# Patient Record
Sex: Female | Born: 1940 | Hispanic: No
Health system: Southern US, Community
[De-identification: ages and names within clinical notes are randomized; demographics above are authoritative.]

## PROBLEM LIST (undated history)

## (undated) DIAGNOSIS — M545 Low back pain, unspecified: Secondary | ICD-10-CM

## (undated) DIAGNOSIS — M25551 Pain in right hip: Secondary | ICD-10-CM

## (undated) DIAGNOSIS — S32000A Wedge compression fracture of unspecified lumbar vertebra, initial encounter for closed fracture: Secondary | ICD-10-CM

## (undated) DIAGNOSIS — M810 Age-related osteoporosis without current pathological fracture: Secondary | ICD-10-CM

## (undated) HISTORY — PX: OTHER SURGICAL HISTORY: SHX169

---

## 1999-03-22 ENCOUNTER — Ambulatory Visit (HOSPITAL_COMMUNITY): Admission: RE | Admit: 1999-03-22 | Discharge: 1999-03-22 | Payer: Self-pay | Admitting: *Deleted

## 2012-06-20 ENCOUNTER — Other Ambulatory Visit: Payer: Self-pay | Admitting: Endocrinology

## 2012-06-20 DIAGNOSIS — E0590 Thyrotoxicosis, unspecified without thyrotoxic crisis or storm: Secondary | ICD-10-CM

## 2012-06-26 ENCOUNTER — Other Ambulatory Visit: Payer: Self-pay

## 2012-06-28 ENCOUNTER — Ambulatory Visit
Admission: RE | Admit: 2012-06-28 | Discharge: 2012-06-28 | Disposition: A | Discharge status: Home / Self Care | Payer: Medicare Other | Source: Ambulatory Visit | Attending: Endocrinology | Admitting: Endocrinology

## 2012-06-28 DIAGNOSIS — E0590 Thyrotoxicosis, unspecified without thyrotoxic crisis or storm: Secondary | ICD-10-CM

## 2012-07-15 ENCOUNTER — Other Ambulatory Visit (HOSPITAL_COMMUNITY): Payer: Self-pay | Admitting: Endocrinology

## 2012-07-15 DIAGNOSIS — E042 Nontoxic multinodular goiter: Secondary | ICD-10-CM

## 2012-07-15 DIAGNOSIS — E0590 Thyrotoxicosis, unspecified without thyrotoxic crisis or storm: Secondary | ICD-10-CM

## 2012-07-29 ENCOUNTER — Encounter (HOSPITAL_COMMUNITY)
Admission: RE | Admit: 2012-07-29 | Discharge: 2012-07-29 | Disposition: A | Discharge status: Home / Self Care | Payer: Medicare Other | Source: Ambulatory Visit | Attending: Endocrinology | Admitting: Endocrinology

## 2012-07-29 DIAGNOSIS — E0590 Thyrotoxicosis, unspecified without thyrotoxic crisis or storm: Secondary | ICD-10-CM | POA: Insufficient documentation

## 2012-07-29 DIAGNOSIS — E042 Nontoxic multinodular goiter: Secondary | ICD-10-CM

## 2012-07-30 ENCOUNTER — Encounter (HOSPITAL_COMMUNITY)
Admission: RE | Admit: 2012-07-30 | Discharge: 2012-07-30 | Disposition: A | Discharge status: Home / Self Care | Payer: Medicare Other | Source: Ambulatory Visit | Attending: Endocrinology | Admitting: Endocrinology

## 2012-07-30 MED ORDER — SODIUM IODIDE I 131 CAPSULE
7.4000 | Freq: Once | INTRAVENOUS | Status: AC | PRN
  Administered 2012-07-29: 7.4 via ORAL

## 2012-07-30 MED ORDER — SODIUM PERTECHNETATE TC 99M INJECTION
10.3000 | Freq: Once | INTRAVENOUS | Status: AC | PRN
  Administered 2012-07-30: 10 via INTRAVENOUS

## 2012-08-09 ENCOUNTER — Other Ambulatory Visit: Payer: Self-pay | Admitting: Endocrinology

## 2012-08-09 DIAGNOSIS — E042 Nontoxic multinodular goiter: Secondary | ICD-10-CM

## 2012-08-13 ENCOUNTER — Other Ambulatory Visit (HOSPITAL_COMMUNITY)
Admission: RE | Admit: 2012-08-13 | Discharge: 2012-08-13 | Disposition: A | Discharge status: Home / Self Care | Payer: Medicare Other | Source: Ambulatory Visit | Attending: Diagnostic Radiology | Admitting: Diagnostic Radiology

## 2012-08-13 ENCOUNTER — Ambulatory Visit
Admission: RE | Admit: 2012-08-13 | Discharge: 2012-08-13 | Disposition: A | Discharge status: Home / Self Care | Payer: Medicare Other | Source: Ambulatory Visit | Attending: Endocrinology | Admitting: Endocrinology

## 2012-08-13 DIAGNOSIS — E049 Nontoxic goiter, unspecified: Secondary | ICD-10-CM | POA: Insufficient documentation

## 2012-08-13 DIAGNOSIS — E042 Nontoxic multinodular goiter: Secondary | ICD-10-CM

## 2012-08-14 ENCOUNTER — Inpatient Hospital Stay: Admission: RE | Admit: 2012-08-14 | Payer: Medicare Other | Source: Ambulatory Visit

## 2012-08-14 ENCOUNTER — Other Ambulatory Visit: Payer: Medicare Other

## 2013-07-17 ENCOUNTER — Other Ambulatory Visit: Payer: Self-pay | Admitting: Endocrinology

## 2013-07-17 DIAGNOSIS — E042 Nontoxic multinodular goiter: Secondary | ICD-10-CM

## 2013-07-24 ENCOUNTER — Ambulatory Visit
Admission: RE | Admit: 2013-07-24 | Discharge: 2013-07-24 | Disposition: A | Discharge status: Home / Self Care | Payer: Medicare Other | Source: Ambulatory Visit | Attending: Endocrinology | Admitting: Endocrinology

## 2013-07-24 DIAGNOSIS — E042 Nontoxic multinodular goiter: Secondary | ICD-10-CM

## 2013-12-22 ENCOUNTER — Other Ambulatory Visit: Payer: Self-pay | Admitting: Internal Medicine

## 2013-12-22 DIAGNOSIS — E049 Nontoxic goiter, unspecified: Secondary | ICD-10-CM

## 2014-01-27 ENCOUNTER — Other Ambulatory Visit: Payer: Medicare Other

## 2014-03-30 ENCOUNTER — Other Ambulatory Visit: Payer: Medicare Other

## 2014-06-09 IMAGING — US US SOFT TISSUE HEAD/NECK
1 series · 14 of 25 positions shown · non-contrast
Comparison: None.

CLINICAL DATA: Hyperthyroidism, evaluate nodules

THYROID ULTRASOUND
TECHNIQUE: Ultrasound examination of the thyroid gland and adjacent
soft tissues was performed.

[Series 1: us soft tissue head/neck · 0.08mm/px · 14 of 79 slices shown]
[im 1/79]
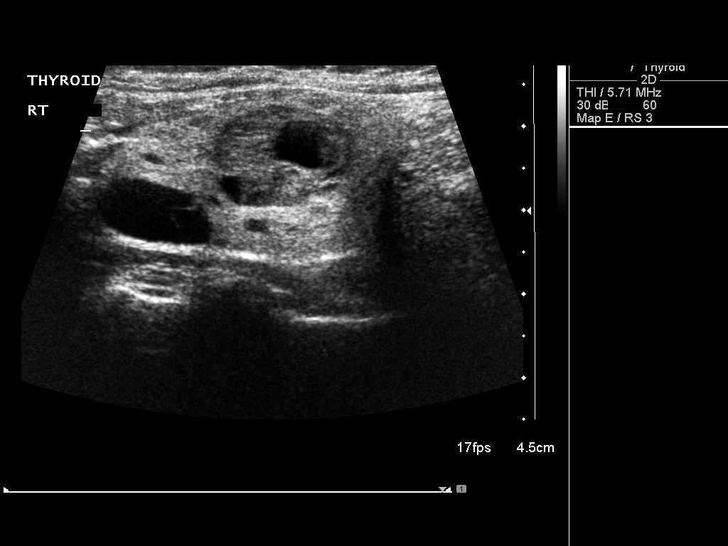
[im 7/79]
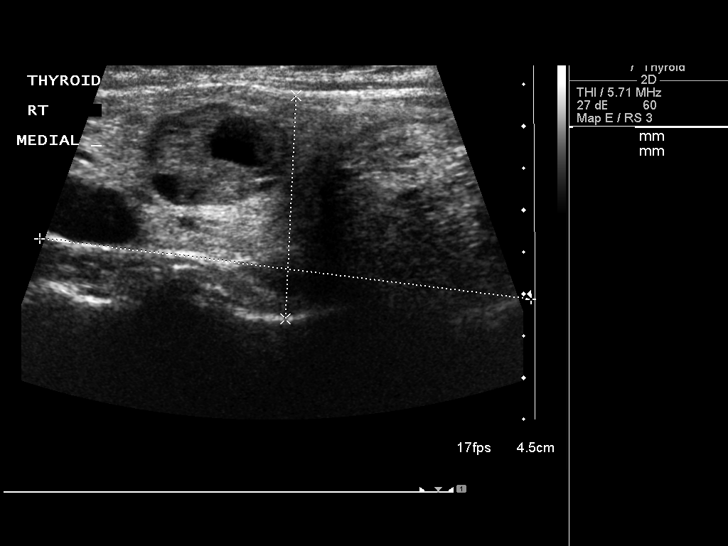
[im 14/79]
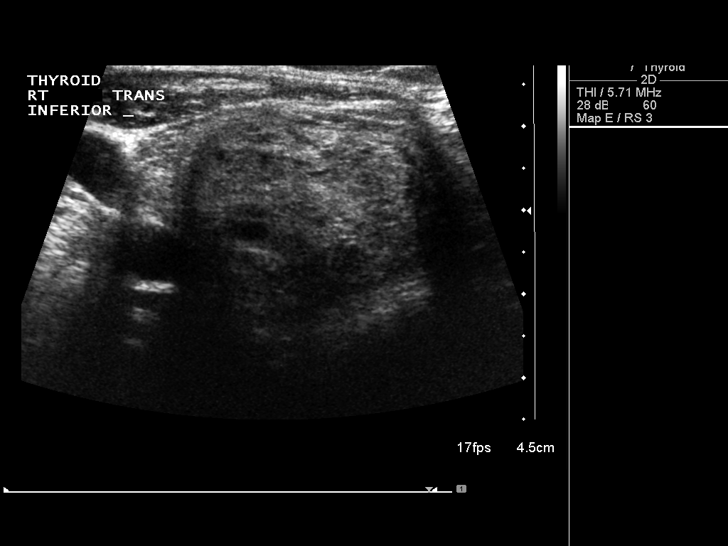
[im 20/79]
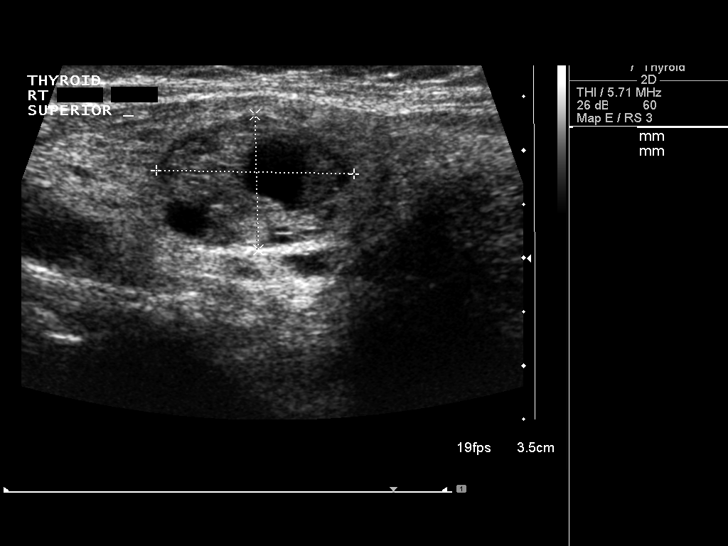
[im 27/79]
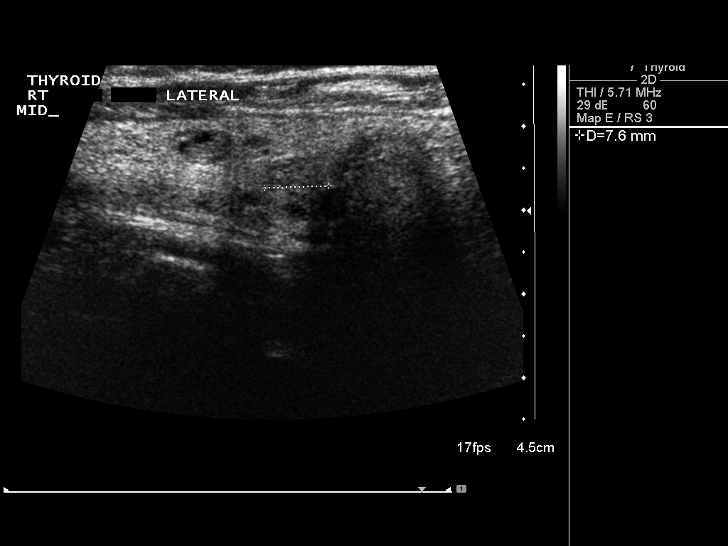
[im 30/79]
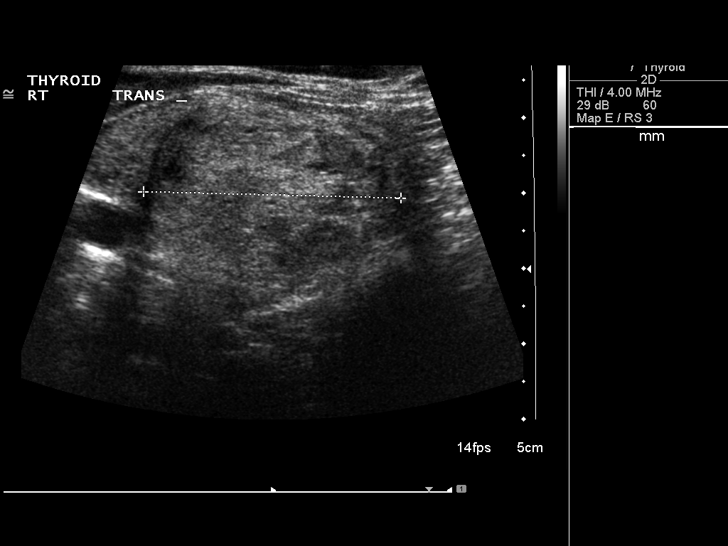
[im 36/79]
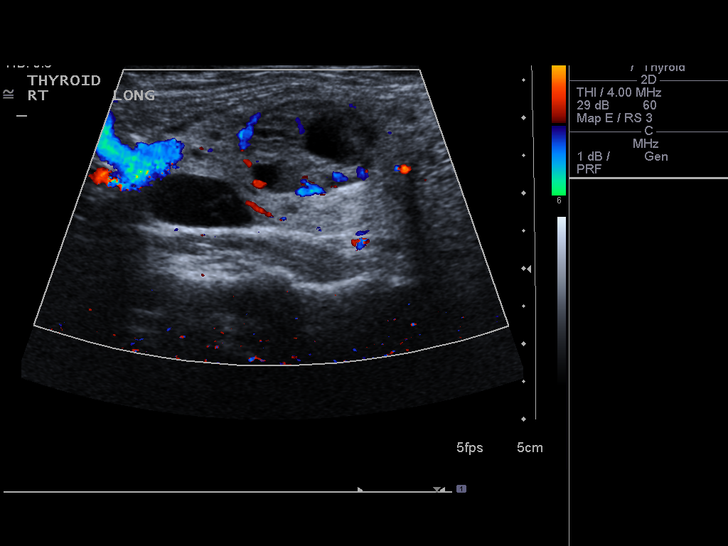
[im 43/79]
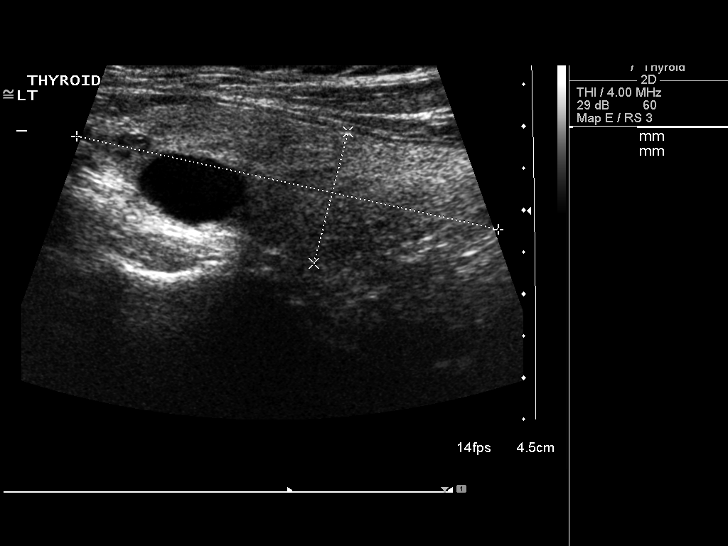
[im 49/79]
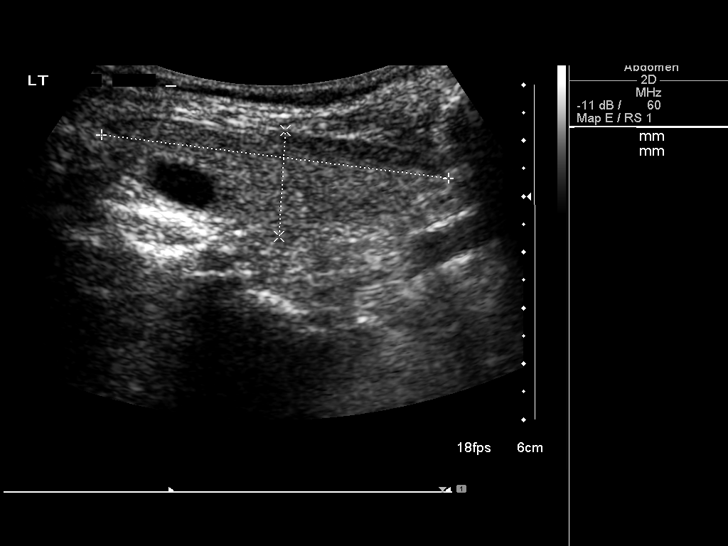
[im 53/79]
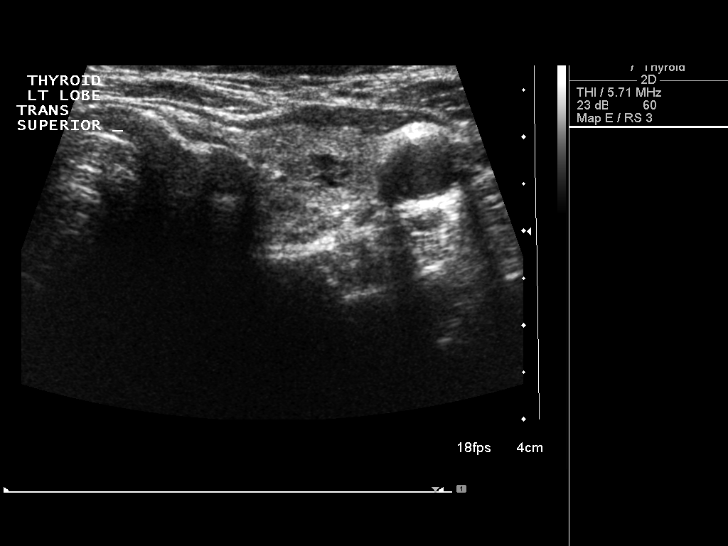
[im 59/79]
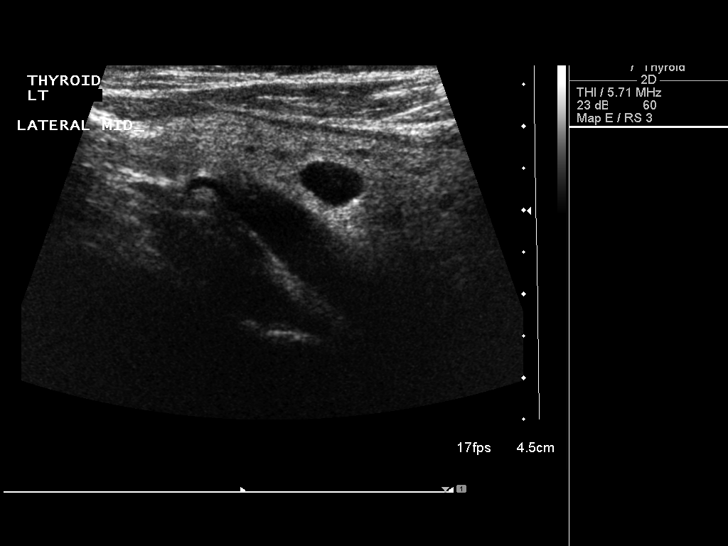
[im 66/79]
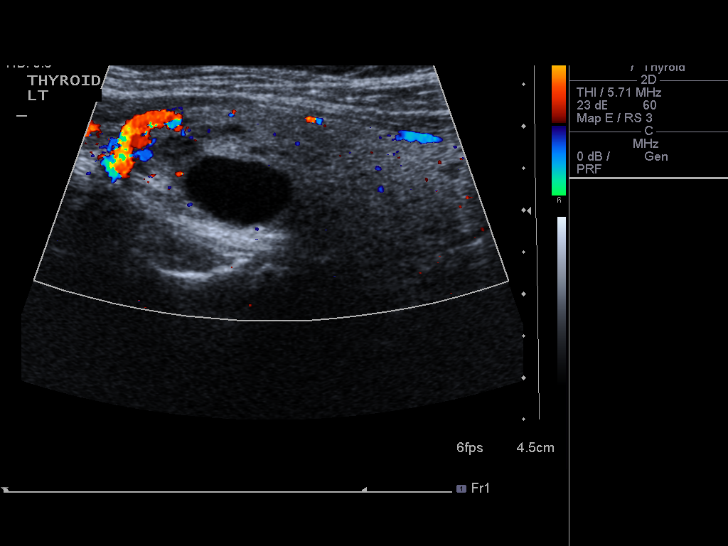
[im 72/79]
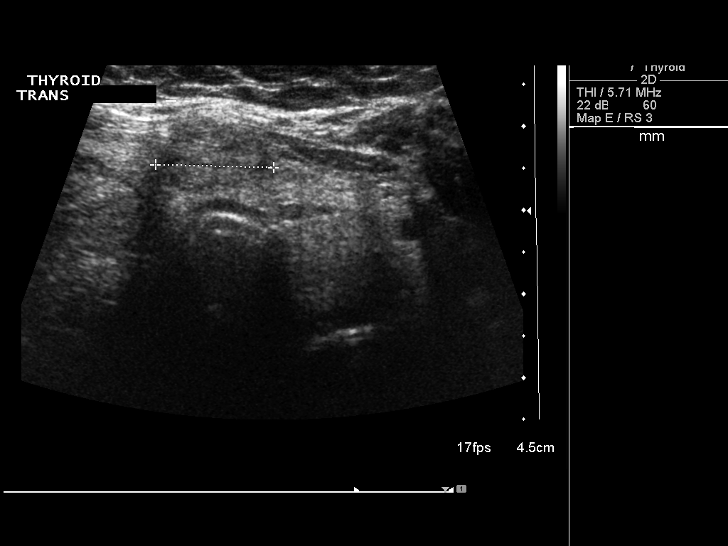
[im 79/79]
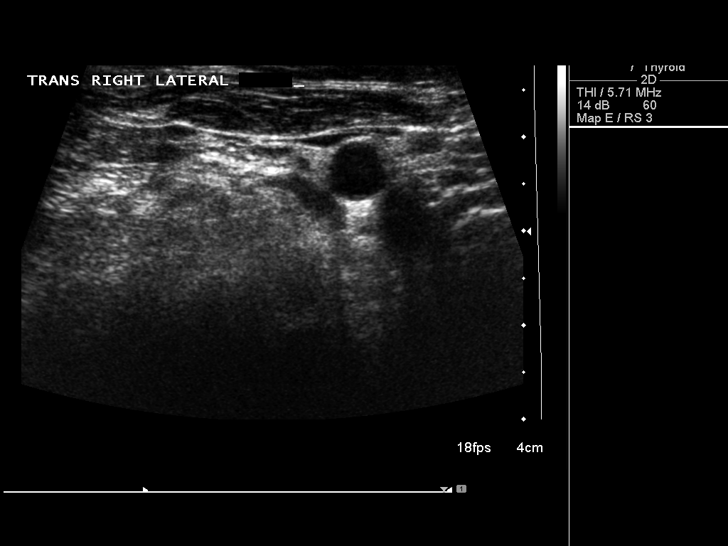

[14 of 25 positions shown; findings below may reference images not displayed]

FINDINGS: Right thyroid lobe:  7.1 x 2.8 x 3.0 cm
Left thyroid lobe:  6.3 x 1.9 x 2.6 cm
Isthmus:

Focal nodules:  Thyroid gland is heterogeneous with multiple cystic
nodules.  There is a large solid nodule in the inferior right lobe
of thyroid gland measuring 3.4 x 2.6 x 3.4 cm.  There is a mixed
solid cystic nodule more superiorly in the right lobe of thyroid
gland measuring 1.8 cm.

Lymphadenopathy:  None visualized.
IMPRESSION: 1..  Multi nodular enlarged thyroid gland with hyperthyroidism is
most consistent with toxic multinodular goiter.
2. Two dominant nodules in the right lobe of thyroid gland are
solid and warrant biopsy to exclude malignancy.

## 2014-07-13 ENCOUNTER — Other Ambulatory Visit (HOSPITAL_COMMUNITY): Payer: Self-pay | Admitting: Interventional Radiology

## 2014-07-13 DIAGNOSIS — S32010B Wedge compression fracture of first lumbar vertebra, initial encounter for open fracture: Secondary | ICD-10-CM

## 2014-07-14 ENCOUNTER — Ambulatory Visit
Admission: RE | Admit: 2014-07-14 | Discharge: 2014-07-14 | Disposition: A | Discharge status: Home / Self Care | Payer: Medicare Other | Source: Ambulatory Visit | Attending: Interventional Radiology | Admitting: Interventional Radiology

## 2014-07-14 DIAGNOSIS — S32010B Wedge compression fracture of first lumbar vertebra, initial encounter for open fracture: Secondary | ICD-10-CM

## 2014-07-14 DIAGNOSIS — S32010A Wedge compression fracture of first lumbar vertebra, initial encounter for closed fracture: Secondary | ICD-10-CM | POA: Insufficient documentation

## 2014-07-14 HISTORY — DX: Low back pain: M54.5

## 2014-07-14 HISTORY — DX: Age-related osteoporosis without current pathological fracture: M81.0

## 2014-07-14 HISTORY — DX: Low back pain, unspecified: M54.50

## 2014-07-14 HISTORY — DX: Wedge compression fracture of unspecified lumbar vertebra, initial encounter for closed fracture: S32.000A

## 2014-07-14 HISTORY — DX: Pain in right hip: M25.551

## 2014-07-14 NOTE — Consult Note (Signed)
Chief Complaint: Chief Complaint  Patient presents with  . Follow-up    4 mo follow up kyphoplasty/verterbroplasty      Referring Physician(s): Pamela Rowe  History of Present Illness: Pamela Rowe is a 74 y.o. female kyphoplasty performed at L1 4 months ago. She had continued pain but this resolved one month after treatment and after physical therapy. She does have some continued achiness, but the severe acute pain from the fracture has resolved. She did fall recently and had radiographs yesterday which were reportedly negative. She did have some continued pain 1 month ago and underwent a lumbar MRI which demonstrated no new vertebral compression fracture. She does have edema in the lower sacrum. This is likely a small insufficiency fracture. She denies any pain along her sacrum. She does complain of some achiness across her low back. She explains that she has been falling because of dizziness and an unsteady gait. She does not know the etiology nor has this been worked up.  Past Medical History  Diagnosis Date  . Osteoporosis   . Low back pain   . Right hip pain   . Compression fracture of lumbar vertebra     Past Surgical History  Procedure Laterality Date  . Kyophoplasy/vertebroplasty        Allergies: Codeine and Morphine and related  Medications: Prior to Admission medications   Medication Sig Start Date End Date Taking? Authorizing Provider  amiodarone (PACERONE) 200 MG tablet Take 200 mg by mouth 2 (two) times daily. Take two tablets (total of 400 mg) po bid   Yes Historical Provider, MD  aspirin EC 81 MG tablet Take 81 mg by mouth daily.   Yes Historical Provider, MD  b complex vitamins tablet Take 1 tablet by mouth daily.   Yes Historical Provider, MD  cholecalciferol (VITAMIN D) 400 UNITS TABS tablet Take 400 Units by mouth.   Yes Historical Provider, MD  Cyanocobalamin (VITAMIN B 12 PO) Place 1 tablet under the tongue daily.   Yes Historical Provider, MD    escitalopram (LEXAPRO) 20 MG tablet Take 20 mg by mouth daily.   Yes Historical Provider, MD  furosemide (LASIX) 40 MG tablet Take 40 mg by mouth daily.   Yes Historical Provider, MD  losartan (COZAAR) 100 MG tablet Take 100 mg by mouth daily.   Yes Historical Provider, MD  methimazole (TAPAZOLE) 5 MG tablet Take 5 mg by mouth daily.   Yes Historical Provider, MD  methocarbamol (ROBAXIN) 500 MG tablet Take 500 mg by mouth every 6 (six) hours as needed for muscle spasms.   Yes Historical Provider, MD  ondansetron (ZOFRAN) 8 MG tablet Take by mouth every 8 (eight) hours as needed for nausea or vomiting.   Yes Historical Provider, MD  ranitidine (ZANTAC) 150 MG tablet Take 150 mg by mouth 2 (two) times daily.   Yes Historical Provider, MD  rivaroxaban (XARELTO) 20 MG TABS tablet Take 20 mg by mouth daily with supper.   Yes Historical Provider, MD     No family history on file.  History   Social History  . Marital Status: Divorced    Spouse Name: N/A  . Number of Children: N/A  . Years of Education: N/A   Social History Main Topics  . Smoking status: Not on file  . Smokeless tobacco: Not on file  . Alcohol Use: Not on file  . Drug Use: Not on file  . Sexual Activity: Not on file   Other Topics Concern  .  Not on file   Social History Narrative  . No narrative on file      Review of Systems: A 12 point ROS discussed and pertinent positives are indicated in the HPI above.  All other systems are negative.  Review of Systems  Vital Signs: BP 173/77 mmHg  Pulse 55  Temp(Src) 98 F (36.7 C) (Oral)  Resp 14  Ht 4\' 11"  (1.499 m)  Wt 140 lb (63.504 kg)  BMI 28.26 kg/m2  SpO2 99%  Physical Exam  Constitutional: She is oriented to person, place, and time. She appears well-developed and well-nourished.  Musculoskeletal:  No lumbar or thoracic spine tenderness  Neurological: She is alert and oriented to person, place, and time.    Mallampati Score:     Imaging: No  results found.  Labs:  CBC: No results for input(s): WBC, HGB, HCT, PLT in the last 8760 hours.  COAGS: No results for input(s): INR, APTT in the last 8760 hours.  BMP: No results for input(s): NA, K, CL, CO2, GLUCOSE, BUN, CALCIUM, CREATININE, GFRNONAA, GFRAA in the last 8760 hours.  Invalid input(s): CMP  LIVER FUNCTION TESTS: No results for input(s): BILITOT, AST, ALT, ALKPHOS, PROT, ALBUMIN in the last 8760 hours.  TUMOR MARKERS: No results for input(s): AFPTM, CEA, CA199, CHROMGRNA in the last 8760 hours.  Assessment and Plan:  Ms. Pamela Rowe has done very well after kyphoplasty from a healing standpoint. The procedure was successful for her and she has no new vertebral compression fracture at this time. She has fallen and is concerned about vertigo. I instructed her to contact her primary care physician for a workup. She was instructed to follow-up with us should she have a fall with acute back pain.  Thank you for this interesting consult.  I greatly enjoyed meeting Pamela Rowe and look forward to participating in their care.  Signed: Brandyce Rowe, ART A 07/14/2014, 1:52 PM   I spent a total of   15 Minutes in face to face in clinical consultation, greater than 50% of which was counseling/coordinating care for back pain and kyphoplasty.

## 2014-10-08 ENCOUNTER — Other Ambulatory Visit: Payer: Self-pay | Admitting: Internal Medicine

## 2014-10-08 DIAGNOSIS — E042 Nontoxic multinodular goiter: Secondary | ICD-10-CM

## 2014-10-16 ENCOUNTER — Encounter (INDEPENDENT_AMBULATORY_CARE_PROVIDER_SITE_OTHER): Payer: Self-pay

## 2014-10-16 ENCOUNTER — Ambulatory Visit
Admission: RE | Admit: 2014-10-16 | Discharge: 2014-10-16 | Disposition: A | Discharge status: Home / Self Care | Payer: Medicare Other | Source: Ambulatory Visit | Attending: Internal Medicine | Admitting: Internal Medicine

## 2014-10-16 DIAGNOSIS — E042 Nontoxic multinodular goiter: Secondary | ICD-10-CM

## 2015-10-20 ENCOUNTER — Other Ambulatory Visit: Payer: Self-pay | Admitting: Internal Medicine

## 2015-10-20 DIAGNOSIS — E042 Nontoxic multinodular goiter: Secondary | ICD-10-CM

## 2015-10-21 ENCOUNTER — Ambulatory Visit
Admission: RE | Admit: 2015-10-21 | Discharge: 2015-10-21 | Disposition: A | Discharge status: Home / Self Care | Payer: Medicare Other | Source: Ambulatory Visit | Attending: Internal Medicine | Admitting: Internal Medicine

## 2015-10-21 DIAGNOSIS — E042 Nontoxic multinodular goiter: Secondary | ICD-10-CM

## 2016-09-26 IMAGING — US US SOFT TISSUE HEAD/NECK
1 series · 13 of 25 positions shown · non-contrast
Comparison: 07/24/2013, 06/28/2012

08/13/2012

CLINICAL DATA: 73-year-old female with a history of goiter.

Previous biopsy of 2 right-sided thyroid nodules 08/13/2012
EXAM:
THYROID ULTRASOUND
TECHNIQUE: Ultrasound examination of the thyroid gland and adjacent soft
tissues was performed.

[Series 1: us soft tissue head/neck · 0.13mm/px · 13 of 68 slices shown]
[im 1/68]
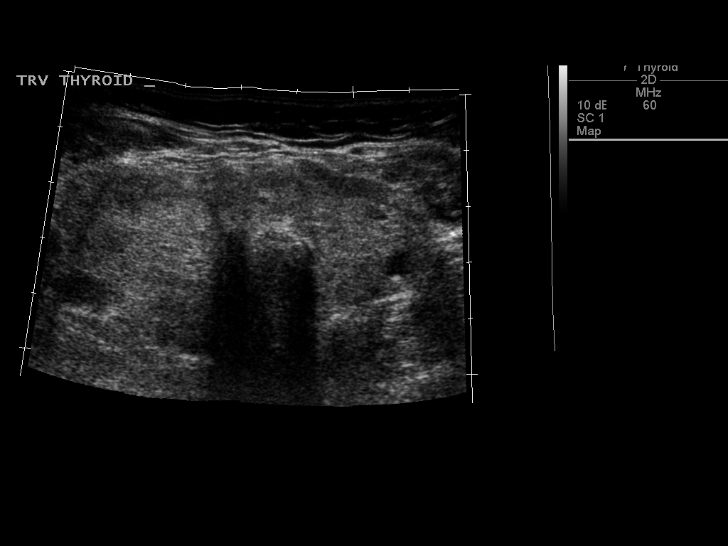
[im 6/68]
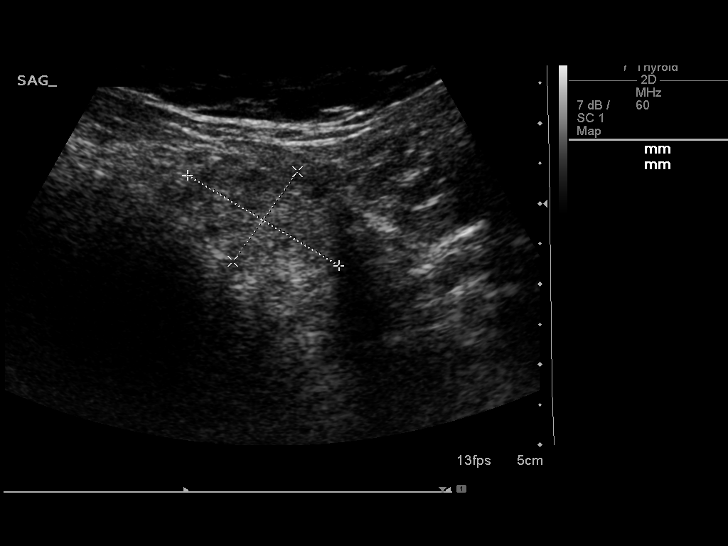
[im 12/68]
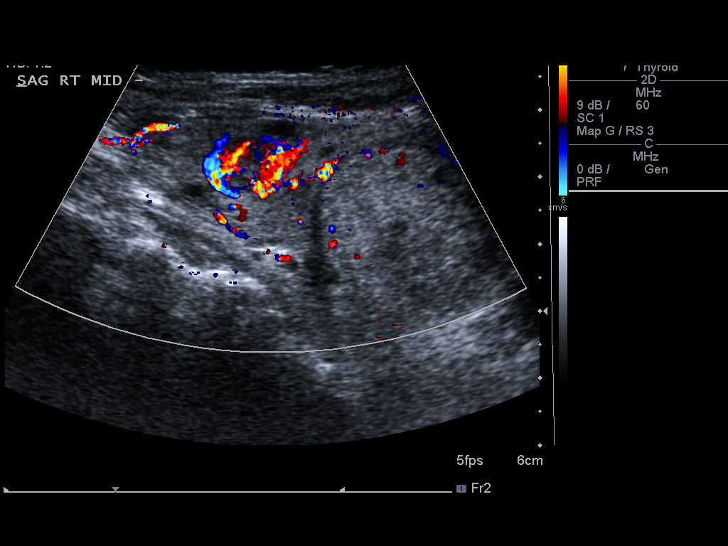
[im 17/68]
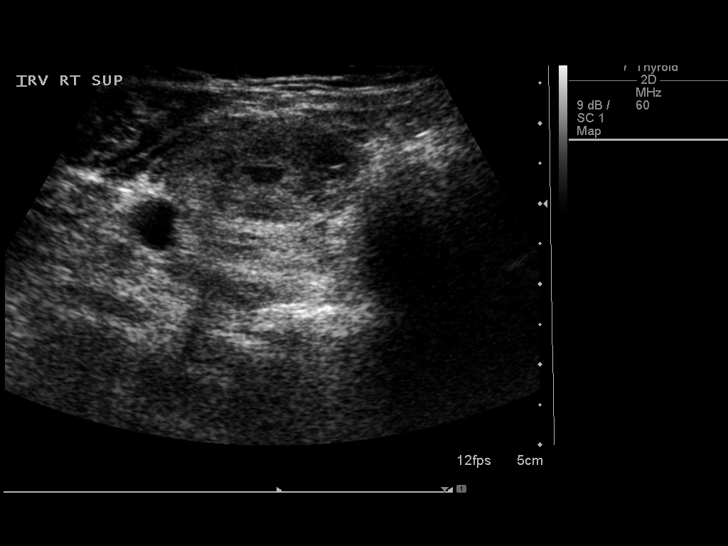
[im 23/68]
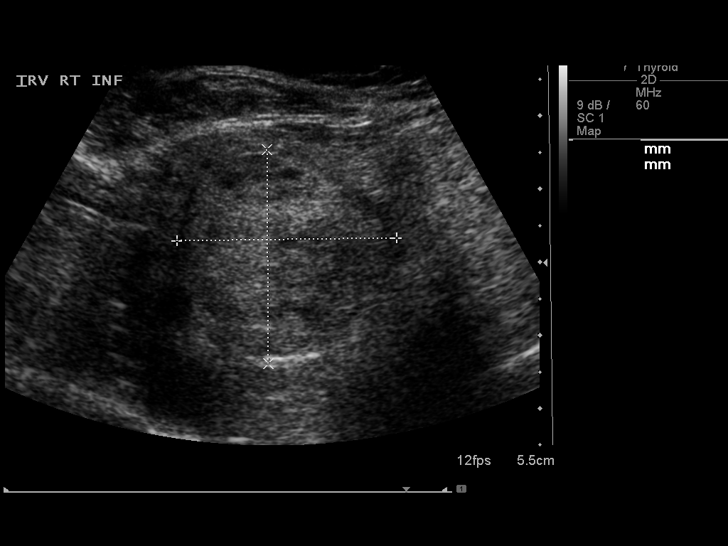
[im 28/68]
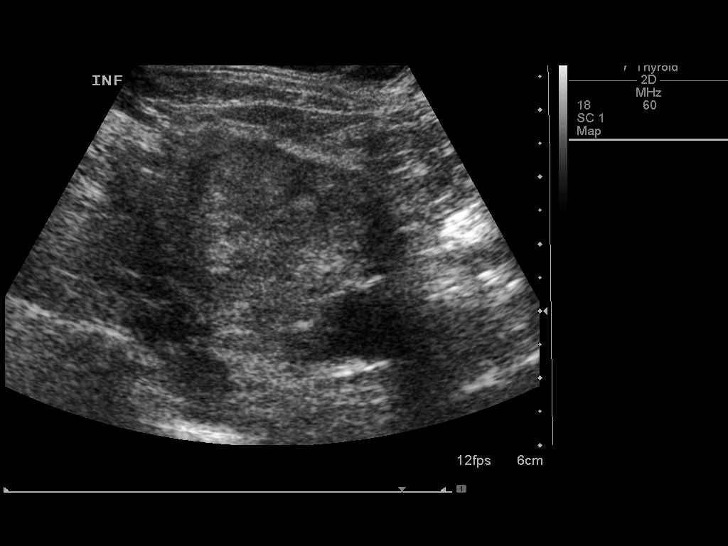
[im 34/68]
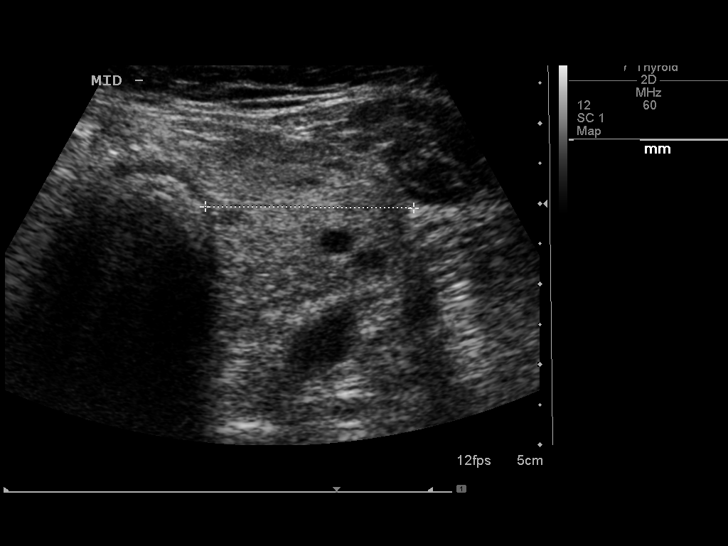
[im 40/68]
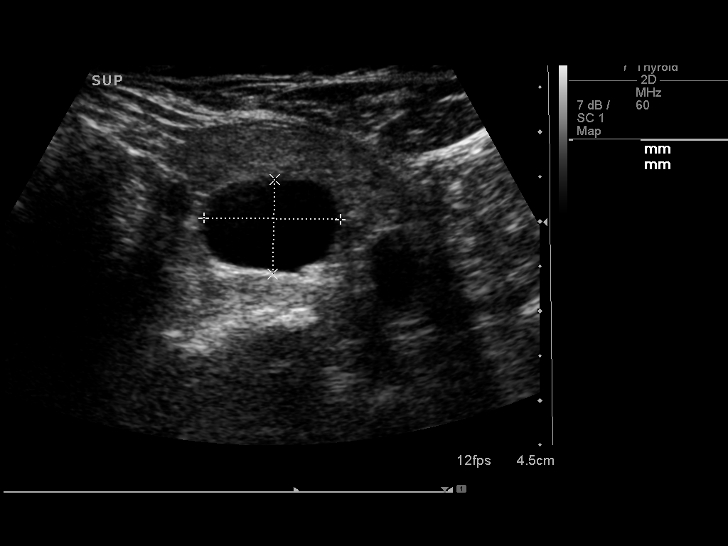
[im 45/68]
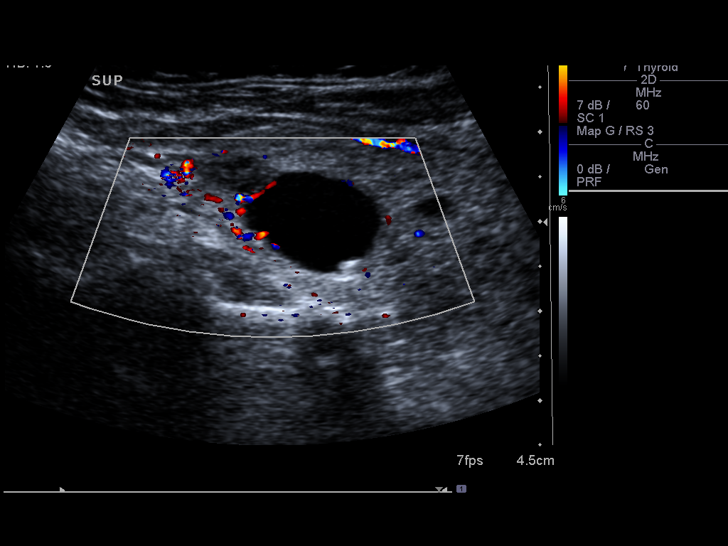
[im 51/68]
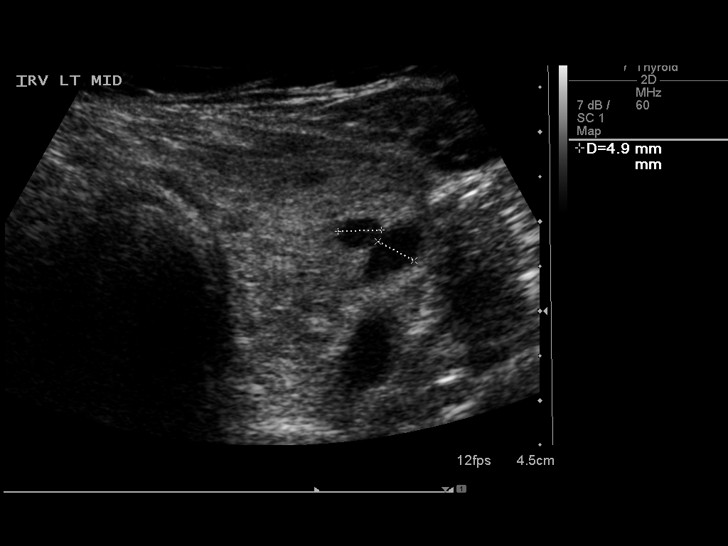
[im 56/68]
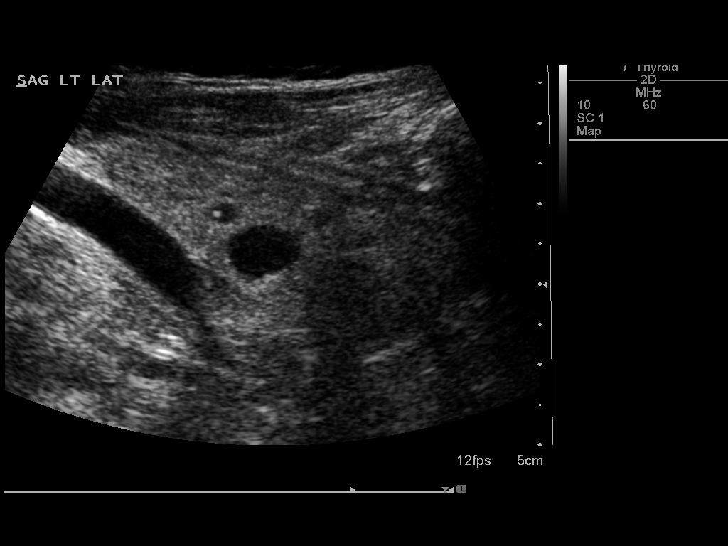
[im 62/68]
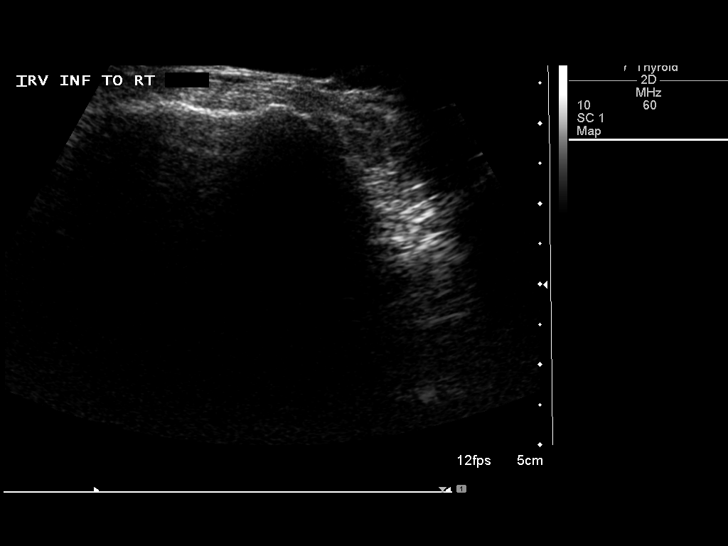
[im 68/68]
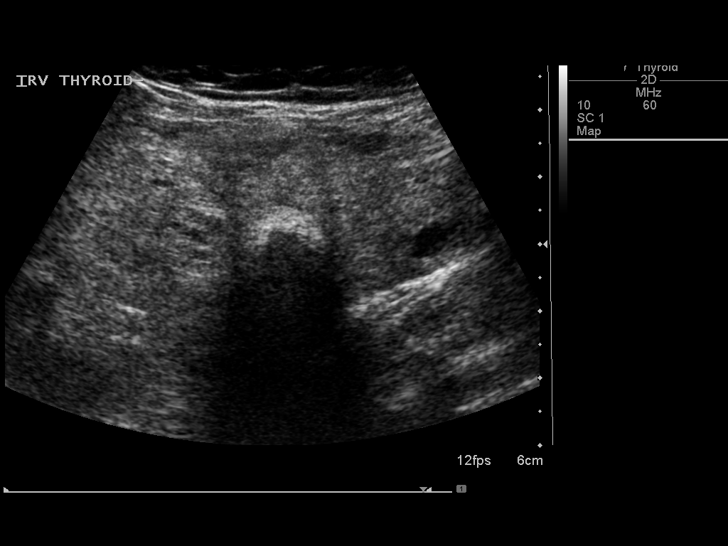

[13 of 25 positions shown; findings below may reference images not displayed]

FINDINGS: Right thyroid lobe

Measurements: 6.4 cm x 3.9 cm x 2.9 cm. Multiple right-sided nodules
identified.

Dominant nodule is inferior, previously biopsied, now measuring
cm x 2.9 cm x 3.2 cm. Solid tissue characteristics.

Next largest is superior measuring 1.9 cm x 1.3 cm x 1.9 cm,
previously biopsied.

Additional nodules on the right are less than 8 mm.

Left thyroid lobe

Measurements: 6.1 cm x 2.4 cm x 2.6 cm. Multiple nodules on the
left, most of which measure less than 8 mm.

Dominant left-sided nodules at the superior thyroid.

Most superior measures 1.3 cm with solid features. No internal
calcifications.

Cystic lesion measures 1.5 cm x 1.1 cm x 1.7 cm, with no complexity
on the wall of the nodule.

Isthmus

Thickness: 1.4 cm. Isthmic nodule measures 1.6 cm x 2.2 cm x 1.4 cm,
appears to have grown in the interval.

Lymphadenopathy

None visualized.
IMPRESSION: Multinodular thyroid. Two right-sided nodules have been previously
biopsied. Recommend correlation with prior biopsy results.

Isthmic nodule meets criteria for biopsy, and has grown since prior
diagnostic ultrasound.

Ultrasound-guided fine needle aspiration should be considered, as
per the consensus statement: Management of Thyroid Nodules Detected
at US: Society of Radiologists in Ultrasound Consensus Conference

Left-sided nodules do not meet criteria for biopsy.

## 2017-10-01 IMAGING — US US SOFT TISSUE HEAD/NECK
1 series · 13 of 25 positions shown · non-contrast
Comparison: 10/16/2014

CLINICAL DATA: Goiter and thyroid nodules.

EXAM:
THYROID ULTRASOUND
TECHNIQUE: Ultrasound examination of the thyroid gland and adjacent soft
tissues was performed.

[Series 1: us soft tissue head/neck · 0.08mm/px · 13 of 52 slices shown]
[im 1/52]
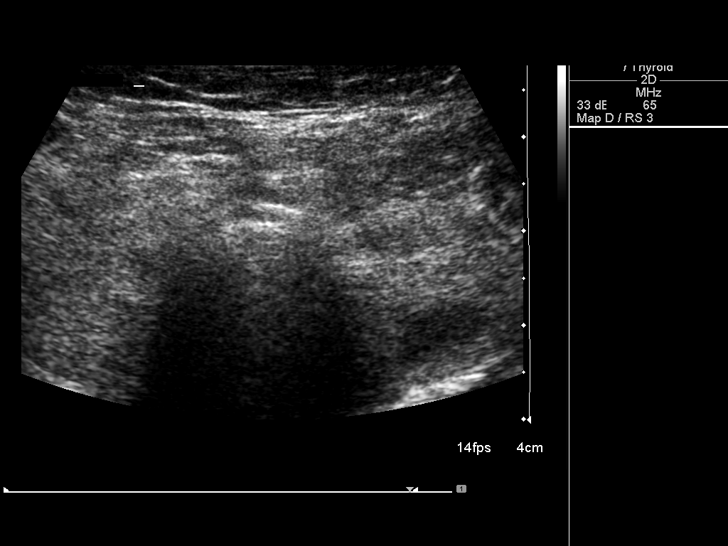
[im 5/52]
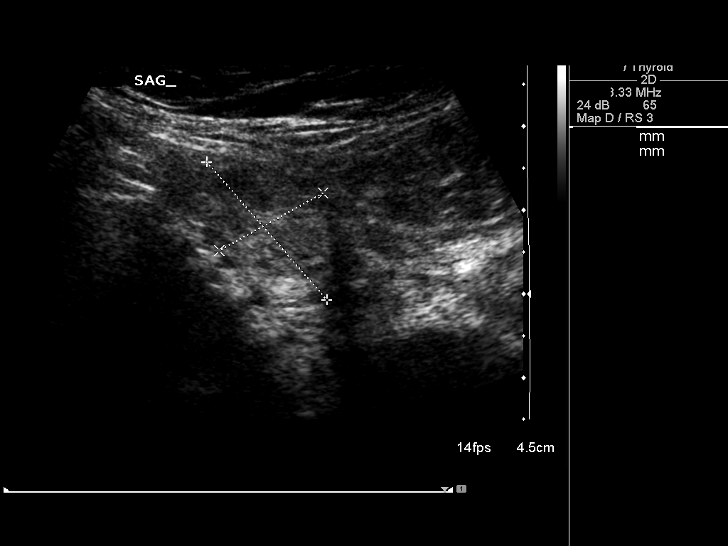
[im 9/52]
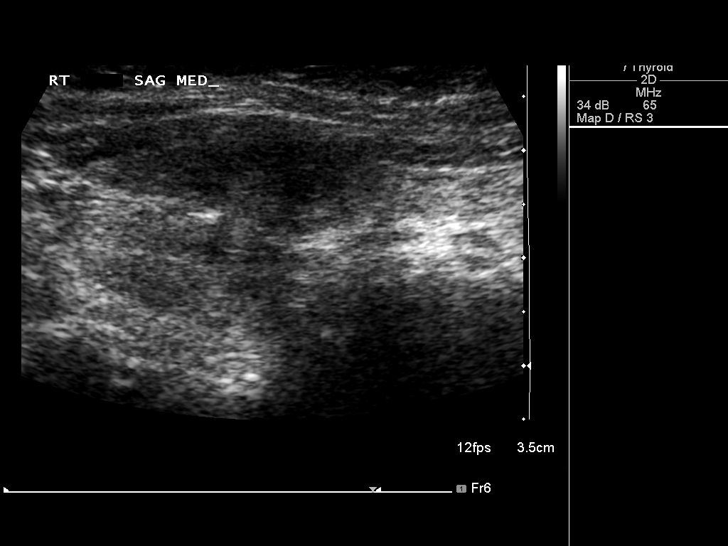
[im 13/52]
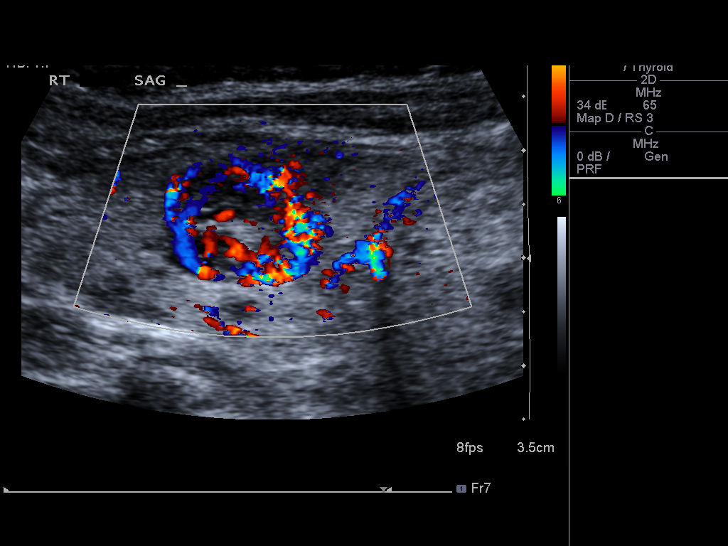
[im 18/52]
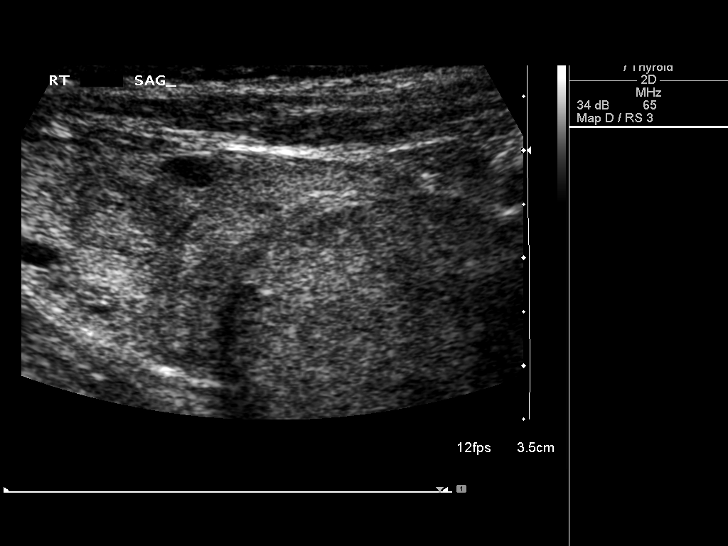
[im 22/52]
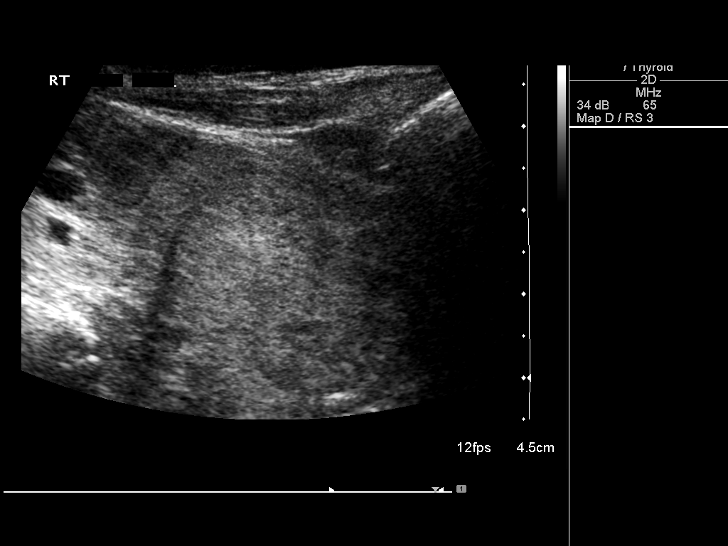
[im 26/52]
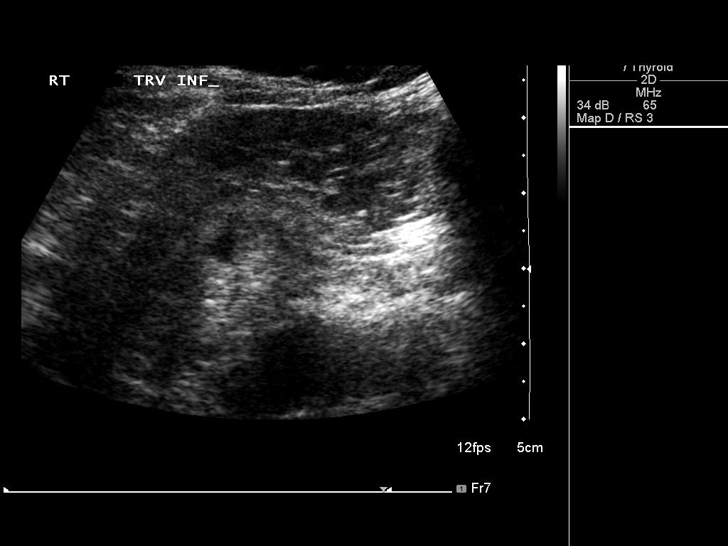
[im 30/52]
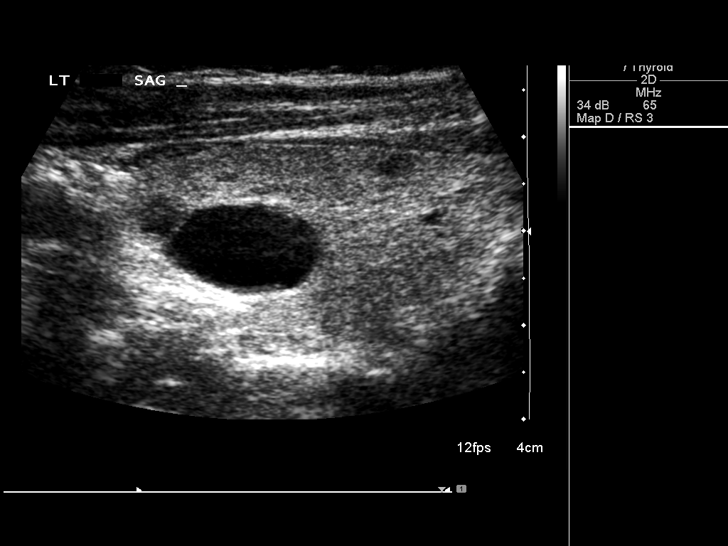
[im 35/52]
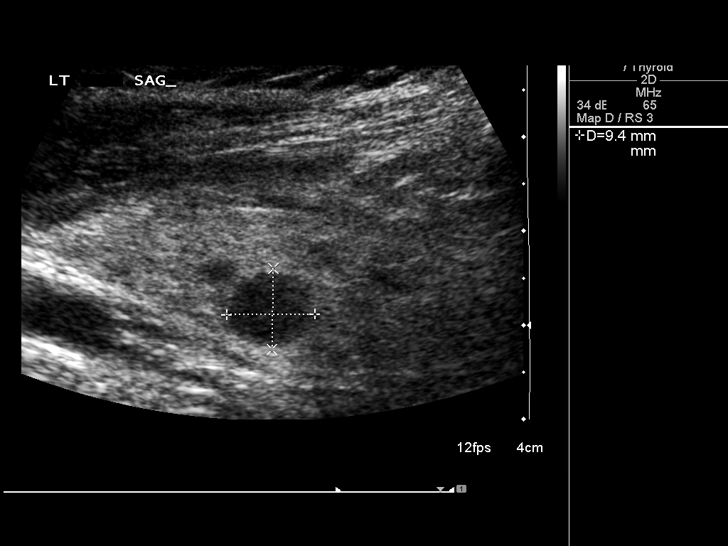
[im 39/52]
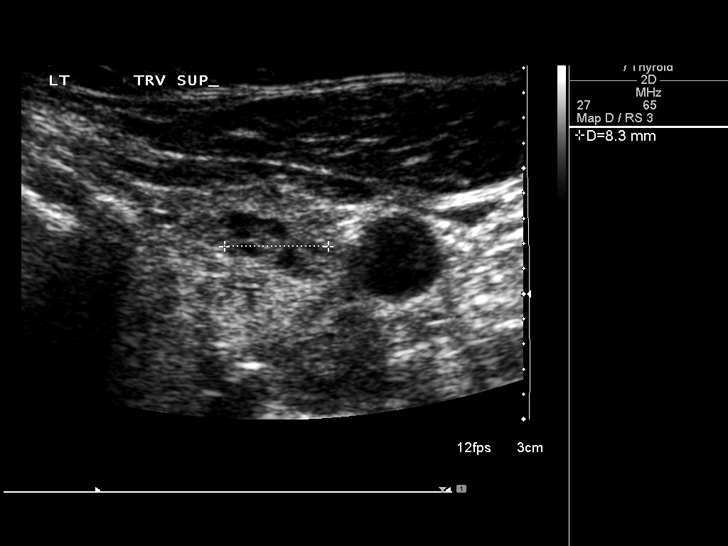
[im 43/52]
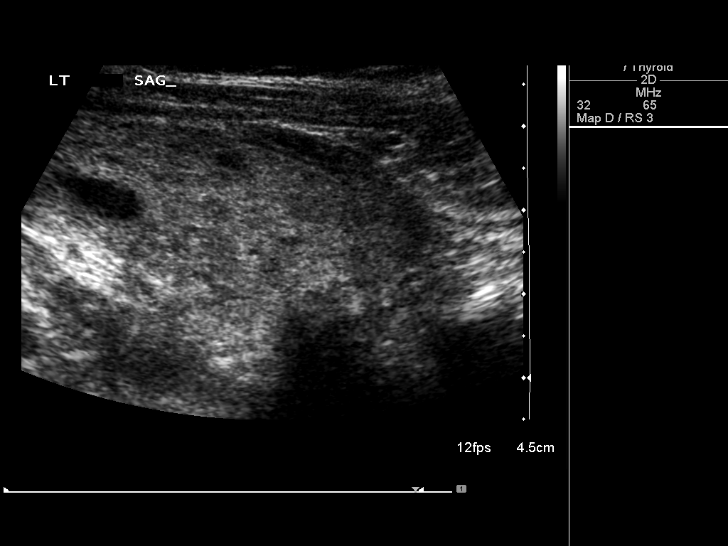
[im 47/52]
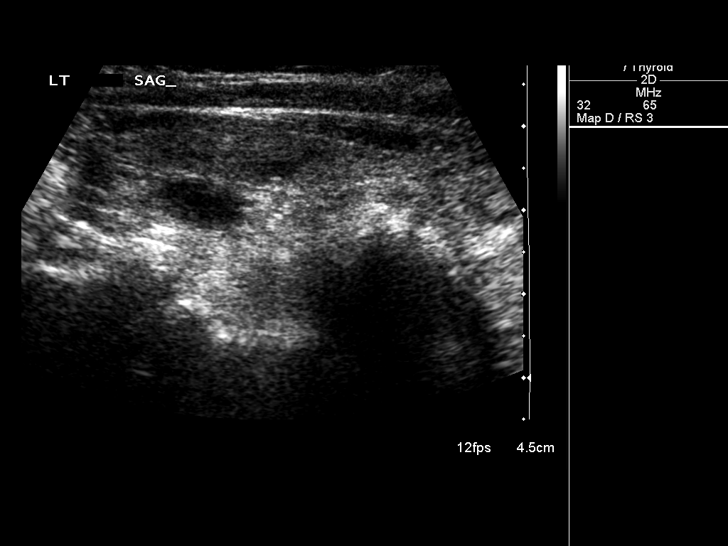
[im 52/52]
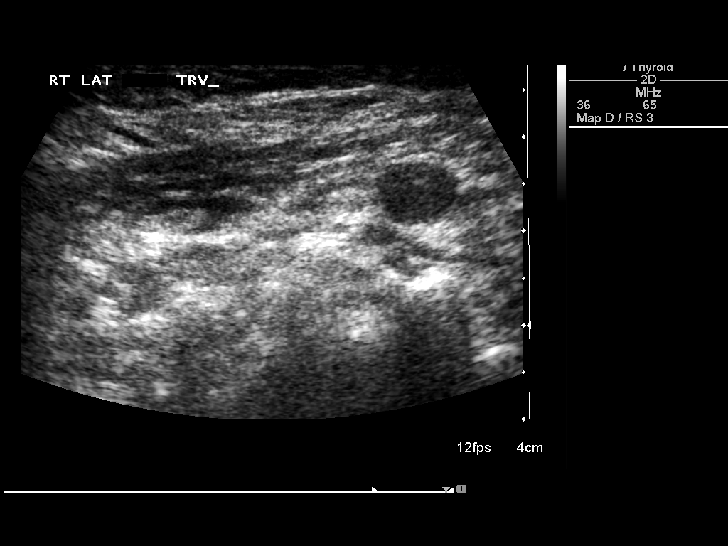

[13 of 25 positions shown; findings below may reference images not displayed]

FINDINGS: Right thyroid lobe

Measurements: 7.3 x 3.6 x 2.8 cm. Heterogeneous nodule in the
superior right thyroid lobe is hypervascular and measures 1.7 x
x 1.6 cm and previously measured 1.9 x 1.3 x 1.9 cm. Slightly
hyperechoic nodule in the inferior right thyroid lobe measures 3.1 x
3.0 x 3.1 cm and previously measured 3.0 x 2.9 x 3.2 cm. Additional
small hypoechoic nodules in the right thyroid lobe measuring less
than 0.5 cm.

Left thyroid lobe

Measurements: 5.9 x 2.4 x 2.5 cm. Cystic structure in the superior
left thyroid lobe measures 1.6 x 0.9 x 1.6 cm and previously
measured 1.5 x 1.1 x 1.7 cm. Hypoechoic nodule in the mid left
thyroid lobe measures up to 0.9 cm and unclear if this was present
or visualized on the previous examination. Small heterogeneous
nodule in superior left thyroid lobe measures 0.9 x 0.4 x 0.8 cm and
previously measured up to 1.3 cm. Additional small heterogeneous
nodules in left thyroid lobe.

Isthmus

Thickness: 1.1 cm. Heterogeneous isthmus nodule measures 2.2 x 1.4 x
1.7 cm and previously measured 2.2 x 1.4 x 1.6 cm.

Lymphadenopathy

None visualized.
IMPRESSION: Multinodular goiter.  No significant change in the dominant nodules.
